# Patient Record
Sex: Female | Born: 1975 | Hispanic: Yes | Smoking: Never smoker
Health system: Southern US, Community
[De-identification: ages and names within clinical notes are randomized; demographics above are authoritative.]

## PROBLEM LIST (undated history)

## (undated) DIAGNOSIS — M5126 Other intervertebral disc displacement, lumbar region: Secondary | ICD-10-CM

## (undated) DIAGNOSIS — M5430 Sciatica, unspecified side: Secondary | ICD-10-CM

---

## 2021-10-30 ENCOUNTER — Emergency Department: Payer: Self-pay

## 2021-10-30 ENCOUNTER — Encounter: Payer: Self-pay | Admitting: Emergency Medicine

## 2021-10-30 ENCOUNTER — Emergency Department
Admission: EM | Admit: 2021-10-30 | Discharge: 2021-10-30 | Disposition: A | Discharge status: Home / Self Care | Payer: Self-pay | Attending: Emergency Medicine | Admitting: Emergency Medicine

## 2021-10-30 ENCOUNTER — Other Ambulatory Visit: Payer: Self-pay

## 2021-10-30 DIAGNOSIS — M5442 Lumbago with sciatica, left side: Secondary | ICD-10-CM | POA: Insufficient documentation

## 2021-10-30 DIAGNOSIS — M5136 Other intervertebral disc degeneration, lumbar region: Secondary | ICD-10-CM

## 2021-10-30 DIAGNOSIS — M5126 Other intervertebral disc displacement, lumbar region: Secondary | ICD-10-CM | POA: Insufficient documentation

## 2021-10-30 DIAGNOSIS — M5432 Sciatica, left side: Secondary | ICD-10-CM

## 2021-10-30 HISTORY — DX: Other intervertebral disc displacement, lumbar region: M51.26

## 2021-10-30 HISTORY — DX: Sciatica, unspecified side: M54.30

## 2021-10-30 LAB — URINALYSIS, ROUTINE W REFLEX MICROSCOPIC
Bilirubin Urine: NEGATIVE
Glucose, UA: NEGATIVE mg/dL
Hgb urine dipstick: NEGATIVE
Ketones, ur: NEGATIVE mg/dL
Nitrite: NEGATIVE
Protein, ur: NEGATIVE mg/dL
Specific Gravity, Urine: 1.02 (ref 1.005–1.030)
pH: 5 (ref 5.0–8.0)

## 2021-10-30 LAB — POC URINE PREG, ED: Preg Test, Ur: NEGATIVE

## 2021-10-30 MED ORDER — ORPHENADRINE CITRATE 30 MG/ML IJ SOLN
60.0000 mg | Freq: Once | INTRAMUSCULAR | Status: AC
  Administered 2021-10-30: 60 mg via INTRAMUSCULAR
  Filled 2021-10-30: qty 2

## 2021-10-30 MED ORDER — MELOXICAM 15 MG PO TABS: 15.0000 mg | ORAL_TABLET | Freq: Every day | ORAL | 0 refills | Status: AC

## 2021-10-30 MED ORDER — KETOROLAC TROMETHAMINE 30 MG/ML IJ SOLN
30.0000 mg | Freq: Once | INTRAMUSCULAR | Status: AC
  Administered 2021-10-30: 30 mg via INTRAMUSCULAR
  Filled 2021-10-30: qty 1

## 2021-10-30 MED ORDER — METHOCARBAMOL 500 MG PO TABS: 500.0000 mg | ORAL_TABLET | Freq: Four times a day (QID) | ORAL | 0 refills | Status: AC

## 2021-10-30 MED ORDER — OXYCODONE-ACETAMINOPHEN 5-325 MG PO TABS
1.0000 | ORAL_TABLET | Freq: Once | ORAL | Status: AC
  Administered 2021-10-30: 1 via ORAL
  Filled 2021-10-30: qty 1

## 2021-10-30 MED ORDER — HYDROCODONE-ACETAMINOPHEN 5-325 MG PO TABS: 1.0000 | ORAL_TABLET | ORAL | 0 refills | Status: AC | PRN

## 2021-10-30 NOTE — ED Provider Notes (Signed)
[facility] Provider Note  Patient Contact: 4:25 PM (approximate)   History   Leg Pain   HPI  Donna Riggs is a 46 y.o. female who presents to the emergency department complaining of worsening back pain and left lower extremity pain.  Patient has a history of herniated disc, does see orthopedics for same.  Patient was having increased pain, radicular symptoms and saw primary care.  They referred her back to the orthopedist.  Her appointment is in 2 weeks.  No bowel or bladder dysfunction, saddle anesthesia or paresthesias.  No recent injuries.  Patient states that the pain is on the left side.     Physical Exam   Triage Vital Signs: ED Triage Vitals  Enc Vitals Group     BP 10/30/21 1539 134/88     Pulse Rate 10/30/21 1539 (!) 110     Resp 10/30/21 1539 16     Temp 10/30/21 1539 100.2 F (37.9 C)     Temp Source 10/30/21 1539 Oral     SpO2 10/30/21 1539 100 %     Weight 10/30/21 1540 155 lb (70.3 kg)     Height 10/30/21 1540 5\' 5"  (1.651 m)     Head Circumference --      Peak Flow --      Pain Score 10/30/21 1539 10     Pain Loc --      Pain Edu? --      Excl. in GC? --     Most recent vital signs: Vitals:   10/30/21 1539  BP: 134/88  Pulse: (!) 110  Resp: 16  Temp: 100.2 F (37.9 C)  SpO2: 100%     General: Alert and in no acute distress.  Cardiovascular:  Good peripheral perfusion Respiratory: Normal respiratory effort without tachypnea or retractions. Lungs CTAB.  Musculoskeletal: Full range of motion to all extremities.  Visualization of the lower back reveals no visible signs of trauma.  Patient is very minimally tender to palpation in the left paraspinal muscle region, no midline tenderness.  Tenderness increases to the SI joint, sciatic notch.  Along the left lateral hip.  Patient has good range of motion to the left lower extremity.  No visible signs of trauma to the left hip.  Patient with pulses and sensation intact  distally. Neurologic:  No gross focal neurologic deficits are appreciated.  Skin:   No rash noted Other:   ED Results / Procedures / Treatments   Labs (all labs ordered are listed, but only abnormal results are displayed) Labs Reviewed  URINALYSIS, ROUTINE W REFLEX MICROSCOPIC - Abnormal; Notable for the following components:      Result Value   Color, Urine YELLOW (*)    APPearance CLOUDY (*)    Leukocytes,Ua SMALL (*)    Bacteria, UA FEW (*)    All other components within normal limits  POC URINE PREG, ED     EKG     RADIOLOGY  I personally viewed, evaluated, and interpreted these images as part of my medical decision making, as well as reviewing the written report by the radiologist.  ED Provider Interpretation: Findings consistent with bulging disc without significant central cord compression.  CT Lumbar Spine Wo Contrast  Result Date: 10/30/2021 CLINICAL DATA:  Low back pain. Rule out cauda equina syndrome. Left leg pain EXAM: CT LUMBAR SPINE WITHOUT CONTRAST TECHNIQUE: Multidetector CT imaging of the lumbar spine was performed without intravenous contrast administration. Multiplanar CT image reconstructions were also  generated. RADIATION DOSE REDUCTION: This exam was performed according to the departmental dose-optimization program which includes automated exposure control, adjustment of the mA and/or kV according to patient size and/or use of iterative reconstruction technique. COMPARISON:  None Available. FINDINGS: Segmentation: 5 lumbar vertebra Alignment: Normal Vertebrae: Negative for fracture or mass Paraspinal and other soft tissues: Negative for paraspinous mass or adenopathy Disc levels: L1-2: Negative L2-3: Negative L3-4: Mild disc bulging.  Negative for stenosis L4-5: Diffuse disc bulging. Small central disc protrusion. Mild facet and ligamentum flavum hypertrophy. Mild central canal stenosis and mild subarticular stenosis bilaterally L5-S1: Moderate central disc  protrusion with associated calcification inferiorly. Mild facet degeneration. Mild central canal stenosis and mild subarticular stenosis bilaterally. IMPRESSION: Small central disc protrusion L4-5 with mild spinal stenosis and mild subarticular stenosis bilaterally Moderate central disc protrusion L5-S1 with mild central canal stenosis and mild subarticular stenosis bilaterally. Electronically Signed   By: Marlan Palau M.D.   On: 10/30/2021 17:08    PROCEDURES:  Critical Care performed: No  Procedures   MEDICATIONS ORDERED IN ED: Medications  oxyCODONE-acetaminophen (PERCOCET/ROXICET) 5-325 MG per tablet 1 tablet (1 tablet Oral Given 10/30/21 1837)  ketorolac (TORADOL) 30 MG/ML injection 30 mg (30 mg Intramuscular Given 10/30/21 1837)  orphenadrine (NORFLEX) injection 60 mg (60 mg Intramuscular Given 10/30/21 1837)     IMPRESSION / MDM / ASSESSMENT AND PLAN / ED COURSE  I reviewed the triage vital signs and the nursing notes.                              Differential diagnosis includes, but is not limited to, bulging disc, herniated disc, pinched nerve, sciatica, lumbar radiculopathy   Patient's diagnosis is consistent with bulging disc with sciatica.  Patient presented with pain radiating from her left back into her left leg.  Patient has a history of bulging/herniated disc with compression on the nerve.  Patient is scheduled to see specialist in 2 weeks the pain was worsening and she presented for evaluation.  Patient had increased pain and CT scan was ordered.  This revealed bulging disks without significant central cord impingement.  No concerning neuro deficits warranting further investigation with MRI.Marland Kitchen  Patient will be given symptom control medication and instructed to follow-up with her neurosurgeon.  Patient is given ED precautions to return to the ED for any worsening or new symptoms.        FINAL CLINICAL IMPRESSION(S) / ED DIAGNOSES   Final diagnoses:  Bulging lumbar  disc  Sciatica of left side     Rx / DC Orders   ED Discharge Orders          Ordered    meloxicam (MOBIC) 15 MG tablet  Daily        10/30/21 1847    methocarbamol (ROBAXIN) 500 MG tablet  4 times daily        10/30/21 1847    HYDROcodone-acetaminophen (NORCO/VICODIN) 5-325 MG tablet  Every 4 hours PRN        10/30/21 1847             Note:  This document was prepared using Dragon voice recognition software and may include unintentional dictation errors.   Racheal Patches, PA-C 10/30/21 1847    Merwyn Katos, MD 10/30/21 [phone]

## 2021-10-30 NOTE — ED Triage Notes (Signed)
Pt to ED from home c/o left leg pain x10 days, came in now because can't sleep, painful to walk and sit.  Denies injury, states has seen a specialist before and told she has herniated disc and issues with sciatic nerve.  States this pain is mor severe than normal.  States pain is burning down posterior left leg from back.  Denies n/v/d, fevers, or urinary changes.  Pt A&Ox4, chest rise even and unlabored, in NAD at this time.

## 2021-10-30 NOTE — ED Notes (Signed)
[id number] spanish interpreter used for AVS instructions

## 2023-04-25 IMAGING — CT CT L SPINE W/O CM
4 of 6 series · 13 of 33 positions shown, 15 images · non-contrast
Comparison: None Available.

CLINICAL DATA: Low back pain. Rule out cauda equina syndrome. Left
leg pain



[Series 4: l spine soft · axial · 0.32mm/px · z∈[+1124,+1164]mm · 2 of 121 slices shown]
[im 21/121  soft-tissue]
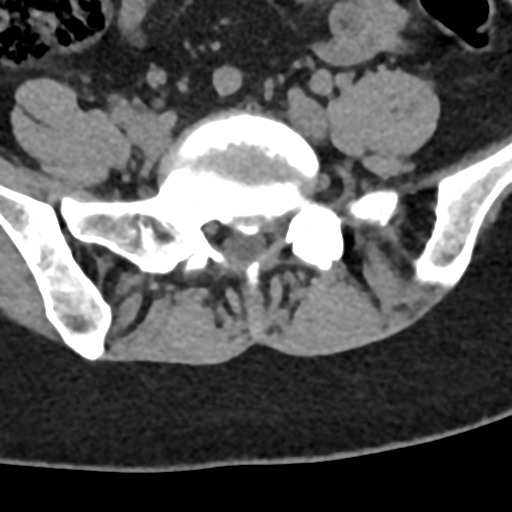
[im 41/121  soft-tissue]
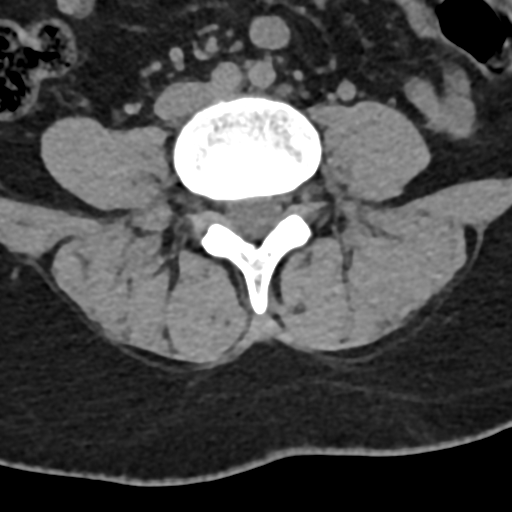

[Series 5: sag bone · sagittal · 0.27mm/px · 5 of 78 slices shown]
[im 13/78  bone]
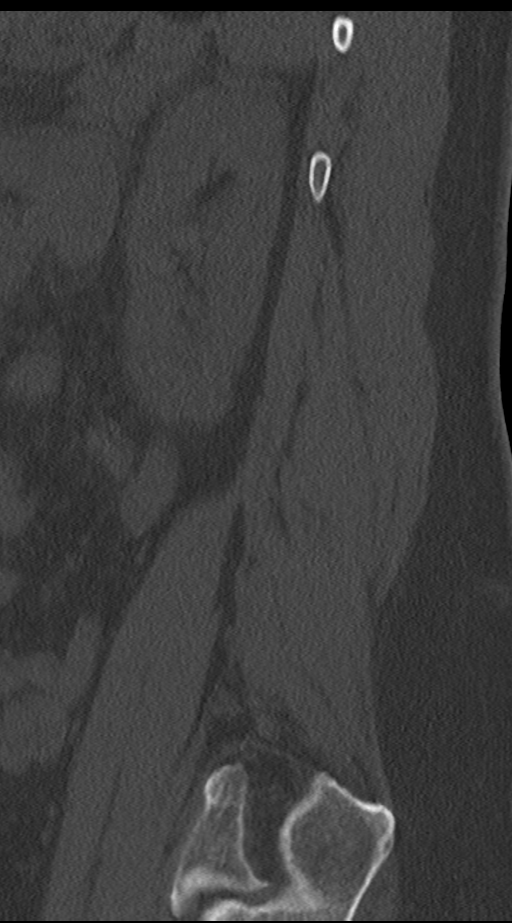
[im 26/78  bone]
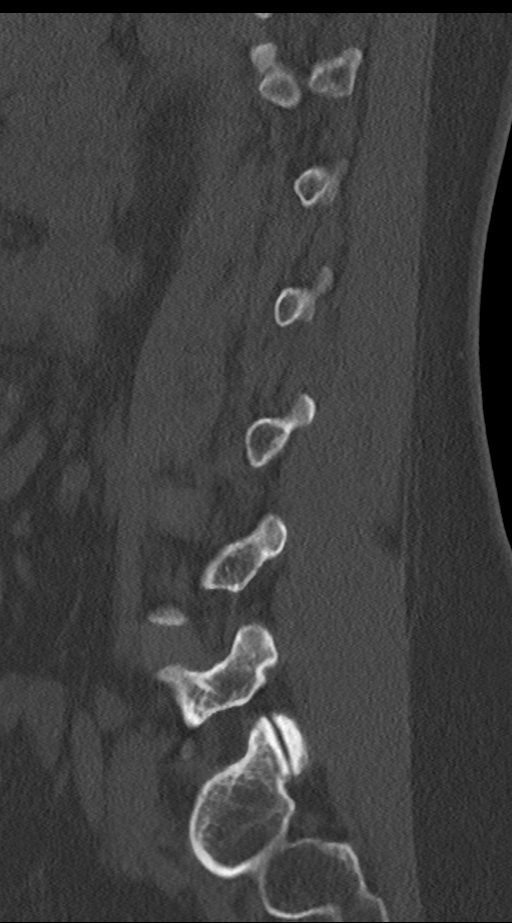
[im 39/78  bone]
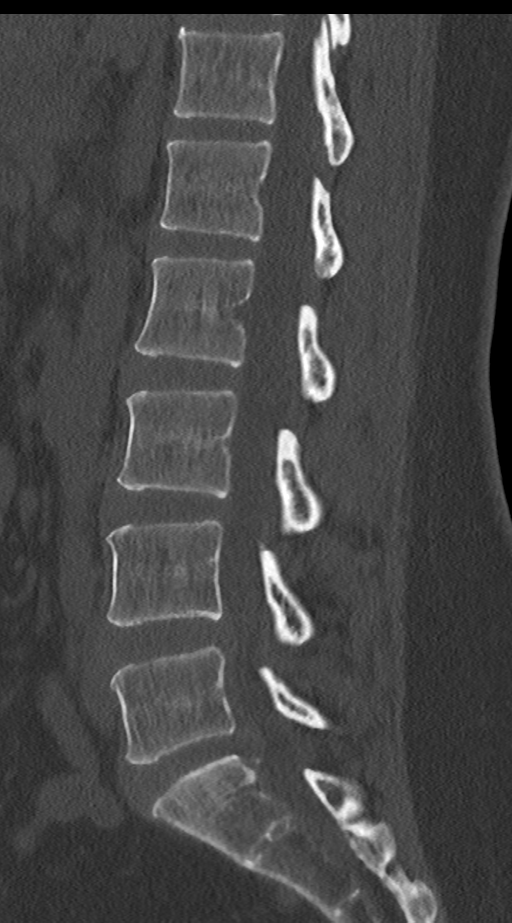
[im 52/78  bone]
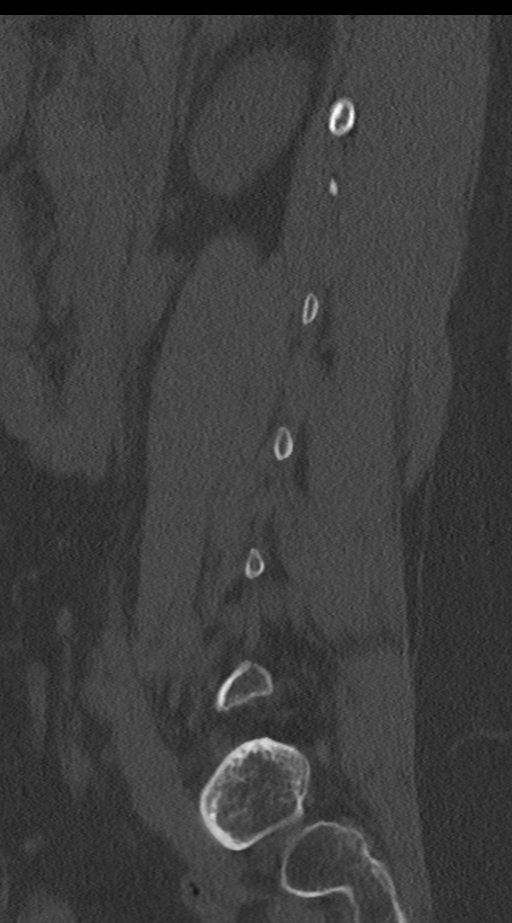
[im 65/78  bone]
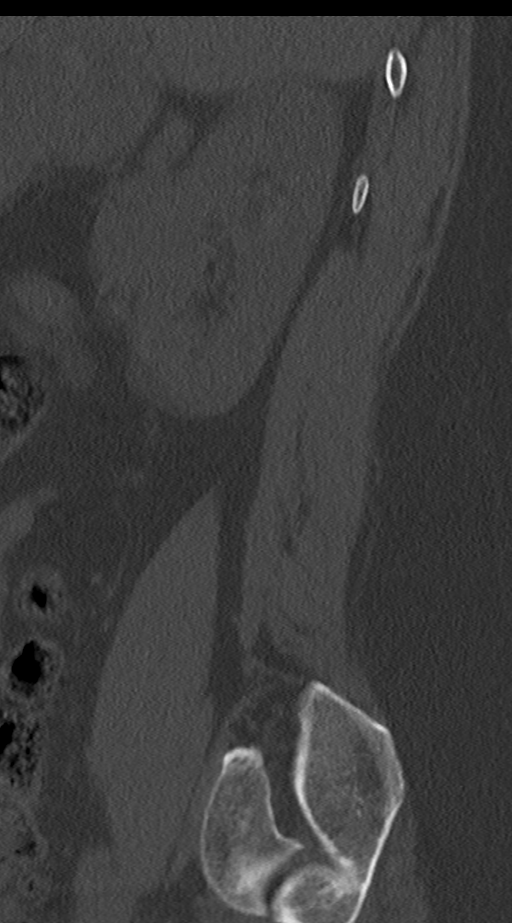

[Series 7: cor bone · coronal · 0.30mm/px · 1 of 69 slices shown]
[im 35/69  bone]
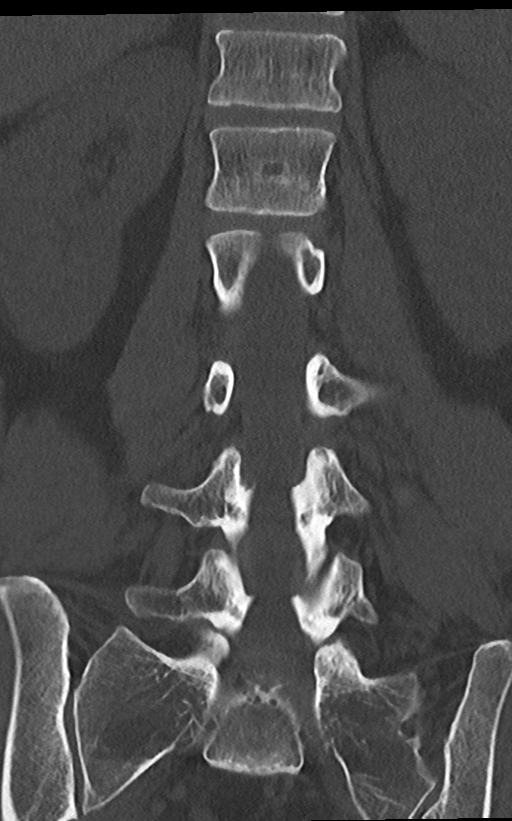

[Series 10: orthogonal · axial · 0.26mm/px · z∈[+1104,+1288]mm · 5 of 119 slices shown, 7 images]
[im 20/119  soft-tissue]
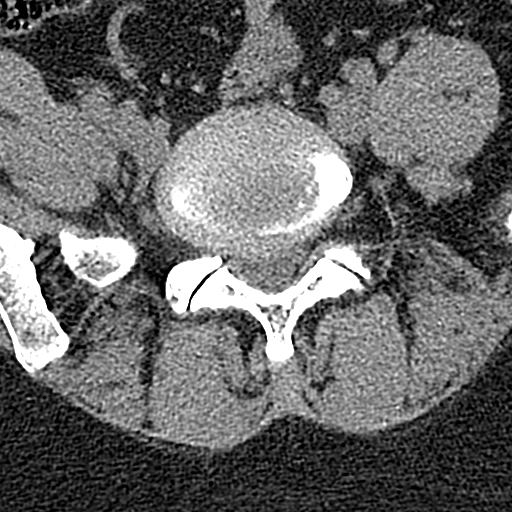
[im 20/119  bone]
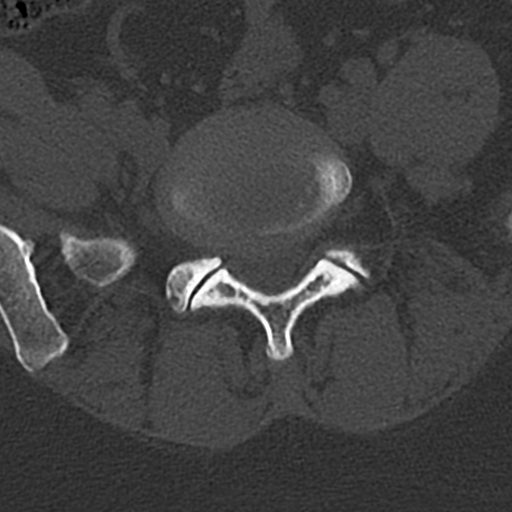
[im 40/119  bone]
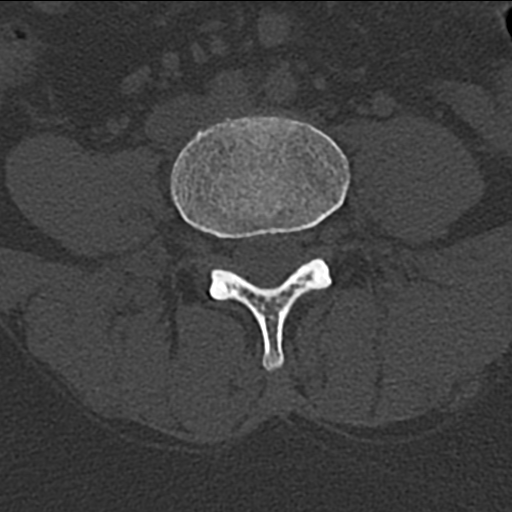
[im 60/119  bone]
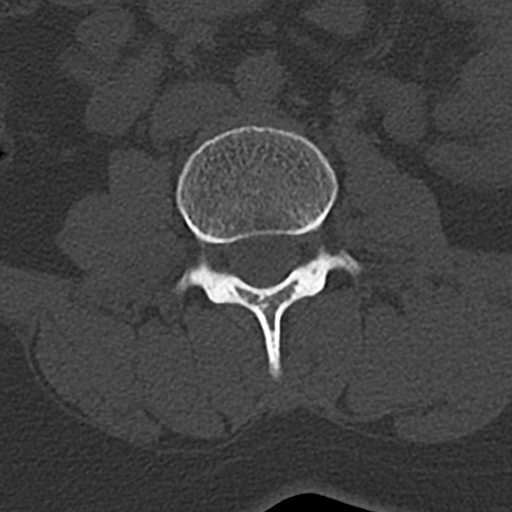
[im 79/119  bone]
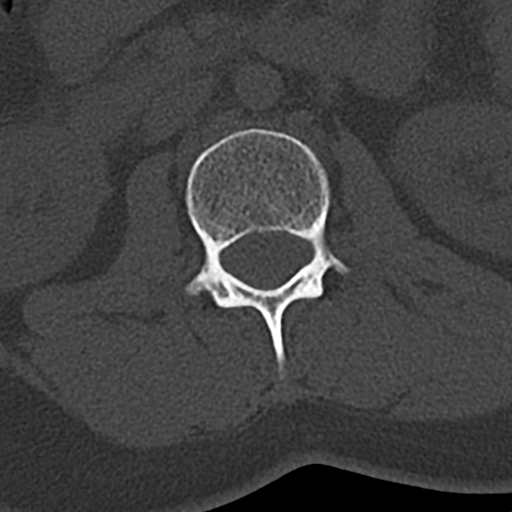
[im 99/119  soft-tissue]
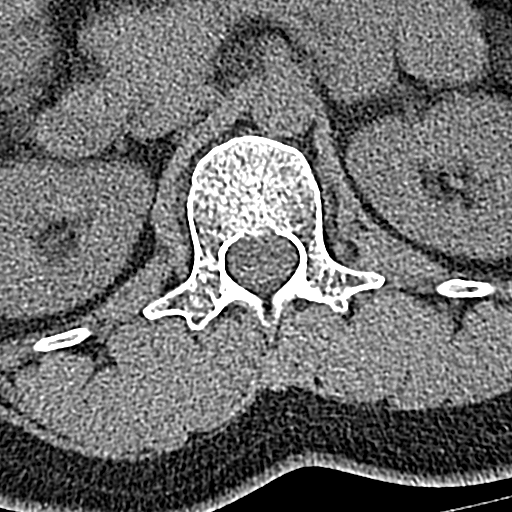
[im 99/119  bone]
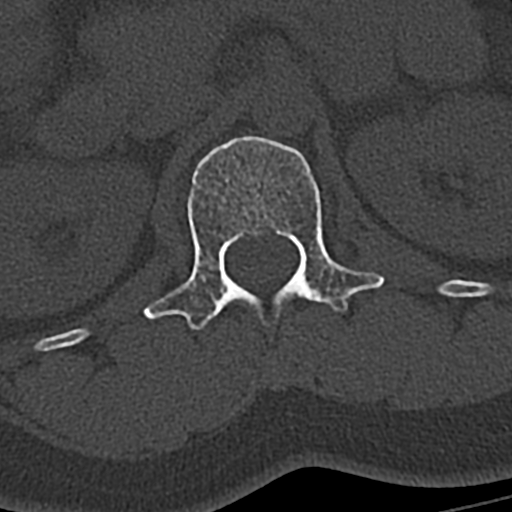

[13 of 33 positions shown; findings below may reference images not displayed]

FINDINGS: Segmentation: 5 lumbar vertebra

Alignment: Normal

Vertebrae: Negative for fracture or mass

Paraspinal and other soft tissues: Negative for paraspinous mass or
adenopathy

Disc levels: L1-2: Negative

L2-3: Negative

L3-4: Mild disc bulging.  Negative for stenosis

L4-5: Diffuse disc bulging. Small central disc protrusion. Mild
facet and ligamentum flavum hypertrophy. Mild central canal stenosis
and mild subarticular stenosis bilaterally

L5-S1: Moderate central disc protrusion with associated
calcification inferiorly. Mild facet degeneration. Mild central
canal stenosis and mild subarticular stenosis bilaterally.
IMPRESSION: Small central disc protrusion L4-5 with mild spinal stenosis and
mild subarticular stenosis bilaterally

Moderate central disc protrusion L5-S1 with mild central canal
stenosis and mild subarticular stenosis bilaterally.

## 2023-06-18 ENCOUNTER — Ambulatory Visit: Payer: Self-pay | Admitting: Family Medicine

## 2023-06-20 ENCOUNTER — Encounter: Payer: Self-pay | Admitting: Family Medicine
